# Patient Record
Sex: Male | Born: 1975 | Hispanic: Yes
Health system: Southern US, Community
[De-identification: ages and names within clinical notes are randomized; demographics above are authoritative.]

---

## 2020-10-19 ENCOUNTER — Emergency Department
Admission: EM | Admit: 2020-10-19 | Discharge: 2020-10-20 | Disposition: A | Discharge status: Home / Self Care | Payer: Self-pay | Attending: Emergency Medicine | Admitting: Emergency Medicine

## 2020-10-19 ENCOUNTER — Emergency Department: Payer: Self-pay

## 2020-10-19 ENCOUNTER — Other Ambulatory Visit: Payer: Self-pay

## 2020-10-19 DIAGNOSIS — Y990 Civilian activity done for income or pay: Secondary | ICD-10-CM | POA: Insufficient documentation

## 2020-10-19 DIAGNOSIS — S2242XA Multiple fractures of ribs, left side, initial encounter for closed fracture: Secondary | ICD-10-CM | POA: Diagnosis not present

## 2020-10-19 DIAGNOSIS — Y92410 Unspecified street and highway as the place of occurrence of the external cause: Secondary | ICD-10-CM | POA: Insufficient documentation

## 2020-10-19 DIAGNOSIS — Z20822 Contact with and (suspected) exposure to covid-19: Secondary | ICD-10-CM | POA: Insufficient documentation

## 2020-10-19 DIAGNOSIS — S0101XA Laceration without foreign body of scalp, initial encounter: Secondary | ICD-10-CM | POA: Insufficient documentation

## 2020-10-19 DIAGNOSIS — S42102A Fracture of unspecified part of scapula, left shoulder, initial encounter for closed fracture: Secondary | ICD-10-CM | POA: Diagnosis not present

## 2020-10-19 DIAGNOSIS — M79652 Pain in left thigh: Secondary | ICD-10-CM | POA: Diagnosis not present

## 2020-10-19 DIAGNOSIS — S299XXA Unspecified injury of thorax, initial encounter: Secondary | ICD-10-CM | POA: Diagnosis present

## 2020-10-19 DIAGNOSIS — S0990XA Unspecified injury of head, initial encounter: Secondary | ICD-10-CM | POA: Insufficient documentation

## 2020-10-19 DIAGNOSIS — R079 Chest pain, unspecified: Secondary | ICD-10-CM | POA: Insufficient documentation

## 2020-10-19 LAB — COMPREHENSIVE METABOLIC PANEL
ALT: 26 U/L (ref 0–44)
AST: 38 U/L (ref 15–41)
Albumin: 4.1 g/dL (ref 3.5–5.0)
Alkaline Phosphatase: 73 U/L (ref 38–126)
Anion gap: 9 (ref 5–15)
BUN: 22 mg/dL — ABNORMAL HIGH (ref 6–20)
CO2: 21 mmol/L — ABNORMAL LOW (ref 22–32)
Calcium: 8.9 mg/dL (ref 8.9–10.3)
Chloride: 109 mmol/L (ref 98–111)
Creatinine, Ser: 0.87 mg/dL (ref 0.61–1.24)
GFR, Estimated: >60 mL/min (ref >60)
Glucose, Bld: 145 mg/dL — ABNORMAL HIGH (ref 70–99)
Potassium: 3.4 mmol/L — ABNORMAL LOW (ref 3.5–5.1)
Sodium: 139 mmol/L (ref 135–145)
Total Bilirubin: 0.5 mg/dL (ref 0.3–1.2)
Total Protein: 7.7 g/dL (ref 6.5–8.1)

## 2020-10-19 LAB — CBC
HCT: 40.2 % (ref 39.0–52.0)
Hemoglobin: 13.8 g/dL (ref 13.0–17.0)
MCH: 30.7 pg (ref 26.0–34.0)
MCHC: 34.3 g/dL (ref 30.0–36.0)
MCV: 89.5 fL (ref 80.0–100.0)
Platelets: 309 10*3/uL (ref 150–400)
RBC: 4.49 MIL/uL (ref 4.22–5.81)
RDW: 13 % (ref 11.5–15.5)
WBC: 13 10*3/uL — ABNORMAL HIGH (ref 4.0–10.5)
nRBC: 0 % (ref 0.0–0.2)

## 2020-10-19 LAB — RESP PANEL BY RT-PCR (FLU A&B, COVID) ARPGX2
Influenza A by PCR: NEGATIVE
Influenza B by PCR: NEGATIVE
SARS Coronavirus 2 by RT PCR: NEGATIVE

## 2020-10-19 MED ORDER — MORPHINE SULFATE (PF) 4 MG/ML IV SOLN: 4.0000 mg | Freq: Once | INTRAVENOUS | Status: DC

## 2020-10-19 MED ORDER — MORPHINE SULFATE (PF) 4 MG/ML IV SOLN
4.0000 mg | Freq: Once | INTRAVENOUS | Status: AC
Start: 2020-10-19 — End: 2020-10-19
  Administered 2020-10-19: 4 mg via INTRAVENOUS
  Filled 2020-10-19: qty 1

## 2020-10-19 MED ORDER — ONDANSETRON HCL 4 MG/2ML IJ SOLN: 4.0000 mg | Freq: Once | INTRAMUSCULAR | Status: DC

## 2020-10-19 MED ORDER — ONDANSETRON HCL 4 MG/2ML IJ SOLN
4.0000 mg | Freq: Once | INTRAMUSCULAR | Status: AC
  Administered 2020-10-19: 4 mg via INTRAVENOUS
  Filled 2020-10-19: qty 2

## 2020-10-19 MED ORDER — IOHEXOL 300 MG/ML  SOLN
75.0000 mL | Freq: Once | INTRAMUSCULAR | Status: AC | PRN
  Administered 2020-10-19: 75 mL via INTRAVENOUS
  Filled 2020-10-19: qty 75

## 2020-10-19 NOTE — ED Notes (Signed)
Pt resting at this time. Respirations even and unlabored. Family sitting with pt. Pt connected to monitor at bedside.
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
## 2020-10-19 NOTE — ED Notes (Addendum)
This RN inquired about pt wanting to file workers comp at this time. Pt stated he needed to think about it more and is unsure at this time. Will inquire at later time.

## 2020-10-19 NOTE — ED Notes (Signed)
Pt to X-Ray at this time.
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
## 2020-10-20 ENCOUNTER — Observation Stay (HOSPITAL_COMMUNITY): Payer: Self-pay

## 2020-10-20 ENCOUNTER — Observation Stay (HOSPITAL_COMMUNITY)
Admission: AD | Admit: 2020-10-20 | Discharge: 2020-10-21 | Disposition: A | Discharge status: Home / Self Care | Payer: Self-pay | Source: Other Acute Inpatient Hospital | Attending: Surgery | Admitting: Surgery

## 2020-10-20 DIAGNOSIS — S2242XA Multiple fractures of ribs, left side, initial encounter for closed fracture: Principal | ICD-10-CM | POA: Insufficient documentation

## 2020-10-20 DIAGNOSIS — S42142A Displaced fracture of glenoid cavity of scapula, left shoulder, initial encounter for closed fracture: Secondary | ICD-10-CM | POA: Insufficient documentation

## 2020-10-20 DIAGNOSIS — S42109A Fracture of unspecified part of scapula, unspecified shoulder, initial encounter for closed fracture: Secondary | ICD-10-CM | POA: Diagnosis present

## 2020-10-20 DIAGNOSIS — Y92410 Unspecified street and highway as the place of occurrence of the external cause: Secondary | ICD-10-CM | POA: Diagnosis not present

## 2020-10-20 DIAGNOSIS — S2249XA Multiple fractures of ribs, unspecified side, initial encounter for closed fracture: Secondary | ICD-10-CM | POA: Diagnosis present

## 2020-10-20 LAB — HIV ANTIBODY (ROUTINE TESTING W REFLEX): HIV Screen 4th Generation wRfx: NONREACTIVE

## 2020-10-20 LAB — CBC
HCT: 39.8 % (ref 39.0–52.0)
Hemoglobin: 13.3 g/dL (ref 13.0–17.0)
MCH: 30.8 pg (ref 26.0–34.0)
MCHC: 33.4 g/dL (ref 30.0–36.0)
MCV: 92.1 fL (ref 80.0–100.0)
Platelets: 265 10*3/uL (ref 150–400)
RBC: 4.32 MIL/uL (ref 4.22–5.81)
RDW: 13.4 % (ref 11.5–15.5)
WBC: 8.8 10*3/uL (ref 4.0–10.5)
nRBC: 0 % (ref 0.0–0.2)

## 2020-10-20 LAB — CREATININE, SERUM: GFR, Estimated: >60 mL/min (ref >60)

## 2020-10-20 MED ORDER — ACETAMINOPHEN 325 MG PO TABS: 650.0000 mg | ORAL_TABLET | ORAL | Status: DC | PRN

## 2020-10-20 MED ORDER — OXYCODONE-ACETAMINOPHEN 5-325 MG PO TABS
1.0000 | ORAL_TABLET | Freq: Once | ORAL | Status: AC
  Administered 2020-10-20: 1 via ORAL
  Filled 2020-10-20 (×2): qty 1

## 2020-10-20 MED ORDER — SODIUM CHLORIDE 0.9% FLUSH: 3.0000 mL | INTRAVENOUS | Status: DC | PRN

## 2020-10-20 MED ORDER — DOCUSATE SODIUM 100 MG PO CAPS
100.0000 mg | ORAL_CAPSULE | Freq: Two times a day (BID) | ORAL | Status: DC
  Administered 2020-10-20 – 2020-10-21 (×2): 100 mg via ORAL
  Filled 2020-10-20 (×3): qty 1

## 2020-10-20 MED ORDER — SODIUM CHLORIDE 0.9 % IV SOLN: 250.0000 mL | INTRAVENOUS | Status: DC | PRN

## 2020-10-20 MED ORDER — OXYCODONE HCL 5 MG PO TABS
10.0000 mg | ORAL_TABLET | ORAL | Status: DC | PRN
  Administered 2020-10-20: 10 mg via ORAL
  Filled 2020-10-20: qty 2

## 2020-10-20 MED ORDER — IBUPROFEN 200 MG PO TABS
800.0000 mg | ORAL_TABLET | Freq: Three times a day (TID) | ORAL | Status: DC
  Administered 2020-10-20 – 2020-10-21 (×2): 800 mg via ORAL
  Filled 2020-10-20 (×2): qty 4

## 2020-10-20 MED ORDER — MORPHINE SULFATE (PF) 4 MG/ML IV SOLN
4.0000 mg | Freq: Once | INTRAVENOUS | Status: AC
  Administered 2020-10-20: 4 mg via INTRAVENOUS
  Filled 2020-10-20: qty 1

## 2020-10-20 MED ORDER — ENOXAPARIN SODIUM 30 MG/0.3ML ~~LOC~~ SOLN: 30.0000 mg | Freq: Two times a day (BID) | SUBCUTANEOUS | Status: DC

## 2020-10-20 MED ORDER — SODIUM CHLORIDE 0.9% FLUSH: 3.0000 mL | Freq: Two times a day (BID) | INTRAVENOUS | Status: DC

## 2020-10-20 MED ORDER — ACETAMINOPHEN 325 MG PO TABS
650.0000 mg | ORAL_TABLET | Freq: Four times a day (QID) | ORAL | Status: DC
  Administered 2020-10-20 – 2020-10-21 (×3): 650 mg via ORAL
  Filled 2020-10-20 (×3): qty 2

## 2020-10-20 MED ORDER — SODIUM CHLORIDE 0.9 % IV SOLN: INTRAVENOUS | Status: DC

## 2020-10-20 MED ORDER — ONDANSETRON HCL 4 MG/2ML IJ SOLN: 4.0000 mg | Freq: Four times a day (QID) | INTRAMUSCULAR | Status: DC | PRN

## 2020-10-20 MED ORDER — ONDANSETRON 4 MG PO TBDP: 4.0000 mg | ORAL_TABLET | Freq: Four times a day (QID) | ORAL | Status: DC | PRN

## 2020-10-20 MED ORDER — OXYCODONE HCL 5 MG PO TABS
5.0000 mg | ORAL_TABLET | ORAL | Status: DC | PRN
  Administered 2020-10-21: 5 mg via ORAL
  Filled 2020-10-20: qty 1

## 2020-10-20 MED ORDER — MORPHINE SULFATE (PF) 4 MG/ML IV SOLN: 4.0000 mg | INTRAVENOUS | Status: DC | PRN

## 2020-10-20 MED ORDER — METHOCARBAMOL 500 MG PO TABS
500.0000 mg | ORAL_TABLET | Freq: Four times a day (QID) | ORAL | Status: DC | PRN
  Administered 2020-10-20: 500 mg via ORAL
  Filled 2020-10-20: qty 1

## 2020-10-20 MED ORDER — METOPROLOL TARTRATE 5 MG/5ML IV SOLN: 5.0000 mg | Freq: Four times a day (QID) | INTRAVENOUS | Status: DC | PRN

## 2020-10-20 MED ORDER — HYDRALAZINE HCL 20 MG/ML IJ SOLN: 10.0000 mg | INTRAMUSCULAR | Status: DC | PRN

## 2020-10-20 MED ORDER — BISACODYL 10 MG RE SUPP: 10.0000 mg | Freq: Every day | RECTAL | Status: DC | PRN

## 2020-10-20 NOTE — ED Notes (Signed)
Pt continues resting. Family remains with pt. Resps even and unlabored. Vitals WNL.

## 2020-10-20 NOTE — ED Notes (Signed)
Report received from [place], [place]. Per Bonita Quin, Diplomatic Services operational officer, patient does have a bed assignment at [facility], but the bed is pending to be clean.
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
## 2020-10-20 NOTE — ED Notes (Signed)
Patient transfer consent signed by mother at bedside. E-signature pad malfunction. See records.

## 2020-10-20 NOTE — ED Notes (Addendum)
Pt assisted with drinking ice water per primary RN
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
## 2020-10-20 NOTE — ED Notes (Addendum)
Pt woke up requesting assistance with urinal and additional pain medication. Katrinka Blazing MD notified. See MAR. Pt also assisted with ice water per request.

## 2020-10-20 NOTE — ED Notes (Signed)
EMTALA reviewed by this RN.  

## 2020-10-20 NOTE — ED Notes (Signed)
Mother provided information on hospital for visiting. Patient verbalized consent for transfer, mother provided signature for transfer, see note.

## 2020-10-20 NOTE — ED Notes (Signed)
Secretary called [facility] to confirm available bed. Still no bed available. EDP aware. Pt resting, asleep. Water provided upon request, cleared by EDP.

## 2020-10-20 NOTE — ED Notes (Signed)
Patient is resting comfortably. Patient updated on POC.  

## 2020-10-20 NOTE — ED Notes (Signed)
Patient visitor updated on POC. Patient appears to be asleep.

## 2020-10-20 NOTE — ED Notes (Signed)
Pt resting. Respirations even and unlabored. NAD noted. Vitals WNL.

## 2020-10-20 NOTE — ED Notes (Signed)
Pt remains resting at this time. Resps even and unlabored. NAD noted. Family remains with pt.

## 2020-10-20 NOTE — ED Notes (Signed)
Report to [facility] provider via phone at this time.

## 2020-10-20 NOTE — ED Notes (Signed)
Called [facility] spoke to [facility] for status update, still awaiting bed assignment [phone]

## 2020-10-20 NOTE — ED Notes (Signed)
Per Junie Panning at [facility] patient going to [facility] Room [id number]

## 2020-10-20 NOTE — ED Notes (Signed)
Called to check on status spoke to Junie Panning is calling to check on update from placement will call back  [phone]

## 2020-10-20 NOTE — ED Notes (Signed)
Patient is resting comfortably. 

## 2020-10-21 DIAGNOSIS — S2242XA Multiple fractures of ribs, left side, initial encounter for closed fracture: Secondary | ICD-10-CM | POA: Diagnosis not present

## 2020-10-21 LAB — CBC
HCT: 40.4 % (ref 39.0–52.0)
Hemoglobin: 13.5 g/dL (ref 13.0–17.0)
MCH: 31 pg (ref 26.0–34.0)
MCHC: 33.4 g/dL (ref 30.0–36.0)
MCV: 92.9 fL (ref 80.0–100.0)
Platelets: 268 10*3/uL (ref 150–400)
RBC: 4.35 MIL/uL (ref 4.22–5.81)
RDW: 13.3 % (ref 11.5–15.5)
WBC: 7.5 10*3/uL (ref 4.0–10.5)
nRBC: 0 % (ref 0.0–0.2)

## 2020-10-21 LAB — BASIC METABOLIC PANEL
Anion gap: 4 — ABNORMAL LOW (ref 5–15)
BUN: 17 mg/dL (ref 6–20)
CO2: 23 mmol/L (ref 22–32)
Calcium: 7.7 mg/dL — ABNORMAL LOW (ref 8.9–10.3)
Chloride: 109 mmol/L (ref 98–111)
Creatinine, Ser: 0.71 mg/dL (ref 0.61–1.24)
GFR, Estimated: >60 mL/min (ref >60)
Glucose, Bld: 124 mg/dL — ABNORMAL HIGH (ref 70–99)
Potassium: 3.4 mmol/L — ABNORMAL LOW (ref 3.5–5.1)
Sodium: 136 mmol/L (ref 135–145)

## 2020-10-21 LAB — MRSA PCR SCREENING: MRSA by PCR: NEGATIVE

## 2020-10-21 MED ORDER — METHOCARBAMOL 500 MG PO TABS: 500.0000 mg | ORAL_TABLET | Freq: Four times a day (QID) | ORAL | 1 refills | Status: AC | PRN

## 2020-10-21 MED ORDER — IBUPROFEN 800 MG PO TABS: 800.0000 mg | ORAL_TABLET | Freq: Three times a day (TID) | ORAL | 1 refills | Status: AC

## 2020-10-21 MED ORDER — OXYCODONE HCL 5 MG PO TABS: 5.0000 mg | ORAL_TABLET | ORAL | 0 refills | Status: AC | PRN

## 2020-10-21 MED ORDER — DOCUSATE SODIUM 100 MG PO CAPS: 100.0000 mg | ORAL_CAPSULE | Freq: Two times a day (BID) | ORAL | 2 refills | Status: AC

## 2020-10-21 NOTE — Discharge Instructions (Signed)
Rib Fracture  A rib fracture is a break or crack in one of the bones of the ribs. The ribs are like a cage that goes around your upper chest. A broken or cracked rib is often painful, but most do not cause other problems. Most rib fractures usually heal on their own in 1-3 months. What are the causes?  Doing movements over and over again with a lot of force, such as pitching a baseball or having a very bad cough.  A direct hit to the chest.  Cancer that has spread to the bones. What are the signs or symptoms?  Pain when you breathe in or cough.  Pain when someone presses on the injured area.  Feeling short of breath. How is this treated? Treatment depends on how bad the fracture is. In general:  Most rib fractures usually heal on their own in 1-3 months.  Healing may take longer if you have a cough or are doing activities that make the injury worse.  While you heal, you may be given medicines to control pain.  You will also be taught deep breathing exercises.  Very bad injuries may require a stay at the hospital or surgery. Follow these instructions at home: Managing pain, stiffness, and swelling  If told, put ice on the injured area. To do this: ? Put ice in a plastic bag. ? Place a towel between your skin and the bag. ? Leave the ice on for 20 minutes, 2-3 times a day. ? Take off the ice if your skin turns bright red. This is very important. If you cannot feel pain, heat, or cold, you have a greater risk of damage to the area.  Take over-the-counter and prescription medicines only as told by your doctor. Activity  Avoid activities that cause pain to the injured area. Protect your injured area.  Slowly increase activity as told by your doctor. General instructions  Do deep breathing exercises as told by your doctor. You may be told to: ? Take deep breaths many times a day. ? Cough several times a day while hugging a pillow. ? Use a device (incentive spirometer) to do  deep breathing many times a day.  Drink enough fluid to keep your pee (urine) clear or pale yellow.  Do not wear a rib belt or binder.  Keep all follow-up visits. Contact a doctor if:  You have a fever. Get help right away if:  You have trouble breathing.  You are short of breath.  You cannot stop coughing.  You cough up thick or bloody spit.  You feel like you may vomit (nauseous), vomit, or have belly (abdominal) pain.  Your pain gets worse and medicine does not help. These symptoms may be an emergency. Get help right away. Call your local emergency services (911 in the [place]  Do not wait to see if the symptoms will go away.  Do not drive yourself to the hospital. Summary  A rib fracture is a break or crack in one of the bones of the ribs.  Apply ice to the injured area and take medicines for pain as told by your doctor.  Take deep breaths and cough several times a day. Hug a pillow every time you cough. This information is not intended to replace advice given to you by your health care provider. Make sure you discuss any questions you have with your health care provider. Document Revised: 10/16/2019 Document Reviewed: 10/16/2019 Elsevier Patient Education  2021 [employer].  How to use a Sling A sling is a type of hanging bandage. You wear it around your neck to protect an injured arm, shoulder, or other body part. You may need to wear a sling so that your injured body part does not move (is immobilized) while it heals. Keeping the injured part of your body still can lessen pain and speed up healing. Your doctor may suggest that you use a sling if you have:  A broken arm.  A broken collarbone.  A shoulder injury.  Surgery. What are the risks? Wearing a sling is safe. In some cases, wearing a sling the wrong way can:  Make your injury worse.  Cause stiffness or loss of feeling (numbness).  Affect blood flow (circulation) in your arm and hand. This can  cause tingling or loss of feeling in your fingers or hands. How to use a sling Follow instructions from your doctor about how and when to wear your sling. Your doctor will show you or tell you:  How to put on the sling.  How to adjust the sling.  When and how often to wear the sling.  How to take off the sling. The way that you use a sling depends on your injury. Follow these instructions (unless your doctor tells you other instructions):  Wear the sling so that your elbow bends to the shape of a capital letter "L" (at a 90-degree angle, also called a right angle).  Make sure the sling supports your wrist and your hand.  Adjust the sling if your fingers or hand start to tingle or lose feeling. Follow these instructions at home:  Try to not move your arm.  Do not twist, lift, or move your arm in a way that could make your injury worse.  Do not lean on your arm while you have to wear a sling.  Do not lift anything while you have to wear a sling. Contact a doctor if:  You have: ? Bruising, swelling, or pain that gets worse. ? Pain that does not get better with medicine. ? A fever.  Your sling: ? Does not support your arm like it should. ? Gets damaged. Get help right away if:  You lose feeling in your fingers.  Your fingers: ? Are tingling. ? Turn blue. ? Feel cold to the touch.  You cannot control the bleeding from your injury.  You have shortness of breath. Summary  A sling is a type of hanging bandage. You wear it around your neck to protect an injured arm, shoulder, or other body part.  You may need to wear a sling so that your injured body part does not move (is immobilized) while it heals.  The way that you use a sling depends on your injury. Follow instructions from your doctor about how and when to wear your sling.  In general, you should wear the sling so that your elbow bends to the shape of a capital letter "L." This information is not intended to  replace advice given to you by your health care provider. Make sure you discuss any questions you have with your health care provider. Document Revised: 10/08/2019 Document Reviewed: 10/08/2019 Elsevier Patient Education  2021 [employer].
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
## 2020-10-21 NOTE — TOC CAGE-AID Note (Signed)
Transition of Care [facility]) - CAGE-AID Screening   Patient Details  Name: Matthew Jacobson MRN: [id number] Date of Birth: February 01, 1976   Hewitt Shorts, RN Trauma Response Nurse Phone Number: 10/21/2020, 10:18 AM   Clinical Narrative:    CAGE-AID Screening:    Have You Ever Felt You Ought to Cut Down on Your Drinking or Drug Use?: No Have People Annoyed You By [employer] Your Drinking Or Drug Use?: No Have You Felt Bad Or Guilty About Your Drinking Or Drug Use?: No Have You Ever Had a Drink or Used Drugs First Thing In The Morning to Steady Your Nerves or to Get Rid of a Hangover?: No CAGE-AID Score: 0  Substance Abuse Education Offered: No
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
## 2020-10-21 NOTE — Progress Notes (Signed)
Patient and family given d/c education and all questions answered. No printed prescriptions to give and no equipment to deliver. IV's removed. Pt taken to car with all belongings.
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
